# Patient Record
Sex: Male | Born: 1935 | Race: Black or African American | Hispanic: No
Health system: Southern US, Community
[De-identification: ages and names within clinical notes are randomized; demographics above are authoritative.]

---

## 2005-03-31 ENCOUNTER — Inpatient Hospital Stay: Payer: Self-pay | Admitting: Family Medicine

## 2005-04-15 ENCOUNTER — Ambulatory Visit: Payer: Self-pay | Admitting: General Surgery

## 2005-04-23 ENCOUNTER — Inpatient Hospital Stay: Payer: Self-pay | Admitting: General Surgery

## 2005-04-26 ENCOUNTER — Inpatient Hospital Stay: Payer: Self-pay | Admitting: General Surgery

## 2007-02-28 ENCOUNTER — Ambulatory Visit: Payer: Self-pay | Admitting: Family Medicine

## 2009-12-12 ENCOUNTER — Ambulatory Visit: Payer: Self-pay | Admitting: Family Medicine

## 2010-03-27 ENCOUNTER — Inpatient Hospital Stay: Payer: Self-pay | Admitting: Internal Medicine

## 2010-04-05 ENCOUNTER — Ambulatory Visit: Payer: Self-pay | Admitting: Internal Medicine

## 2010-04-15 ENCOUNTER — Ambulatory Visit: Payer: Self-pay | Admitting: Family Medicine

## 2010-04-22 ENCOUNTER — Ambulatory Visit: Payer: Self-pay | Admitting: Internal Medicine

## 2010-04-27 ENCOUNTER — Ambulatory Visit: Payer: Self-pay | Admitting: Internal Medicine

## 2010-05-01 ENCOUNTER — Ambulatory Visit: Payer: Self-pay | Admitting: Internal Medicine

## 2010-05-06 ENCOUNTER — Ambulatory Visit: Payer: Self-pay | Admitting: Internal Medicine

## 2010-05-07 ENCOUNTER — Ambulatory Visit: Payer: Self-pay | Admitting: Internal Medicine

## 2010-05-13 ENCOUNTER — Inpatient Hospital Stay: Payer: Self-pay | Admitting: General Surgery

## 2010-05-26 ENCOUNTER — Ambulatory Visit: Payer: Self-pay | Admitting: General Surgery

## 2010-06-04 ENCOUNTER — Ambulatory Visit: Payer: Self-pay | Admitting: General Surgery

## 2010-06-05 ENCOUNTER — Ambulatory Visit: Payer: Self-pay | Admitting: Internal Medicine

## 2010-06-29 ENCOUNTER — Ambulatory Visit: Payer: Self-pay | Admitting: General Surgery

## 2010-07-06 ENCOUNTER — Ambulatory Visit: Payer: Self-pay | Admitting: Internal Medicine

## 2010-09-29 ENCOUNTER — Ambulatory Visit: Payer: Self-pay | Admitting: Family Medicine

## 2010-10-13 ENCOUNTER — Ambulatory Visit: Payer: Self-pay | Admitting: General Surgery

## 2011-05-10 ENCOUNTER — Ambulatory Visit: Payer: Self-pay | Admitting: General Surgery

## 2011-05-12 ENCOUNTER — Ambulatory Visit: Payer: Self-pay | Admitting: General Surgery

## 2011-05-13 ENCOUNTER — Ambulatory Visit: Payer: Self-pay | Admitting: Internal Medicine

## 2011-06-06 ENCOUNTER — Ambulatory Visit: Payer: Self-pay | Admitting: Internal Medicine

## 2011-07-07 ENCOUNTER — Ambulatory Visit: Payer: Self-pay | Admitting: Internal Medicine

## 2011-08-07 ENCOUNTER — Ambulatory Visit: Payer: Self-pay | Admitting: Internal Medicine

## 2011-09-06 ENCOUNTER — Ambulatory Visit: Payer: Self-pay | Admitting: Internal Medicine

## 2011-10-07 ENCOUNTER — Ambulatory Visit: Payer: Self-pay | Admitting: Internal Medicine

## 2011-11-06 ENCOUNTER — Ambulatory Visit: Payer: Self-pay | Admitting: Internal Medicine

## 2011-12-13 ENCOUNTER — Ambulatory Visit: Payer: Self-pay | Admitting: Internal Medicine

## 2012-01-08 IMAGING — CT CT CHEST W/ CM
2 series · 15 of 31 positions shown, 19 images · non-contrast
Comparison: none

REASON FOR EXAM: abd normal chect xray with mass
COMMENTS:

[Series 2: soft tissue · axial · 0.92mm/px · z∈[+550,+600]mm · 2 of 67 slices shown]
[im 6/67  mediastinal]
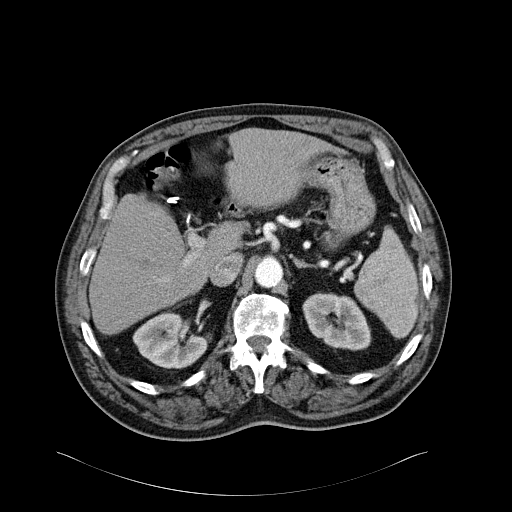
[im 16/67  mediastinal]
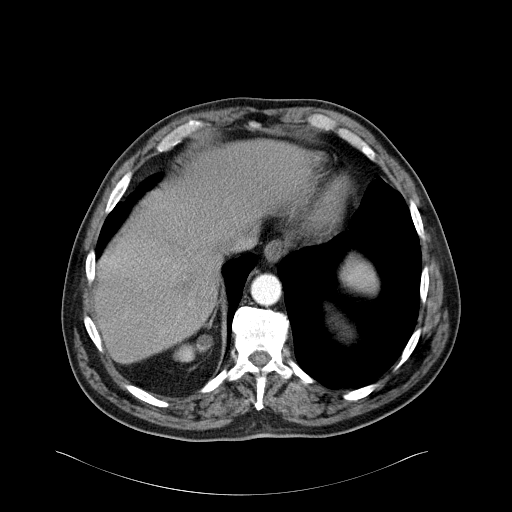

[Series 3: lung windows · axial · 0.86mm/px · z∈[+556,+830]mm · 13 of 65 slices shown, 17 images]
[im 5/65  mediastinal]
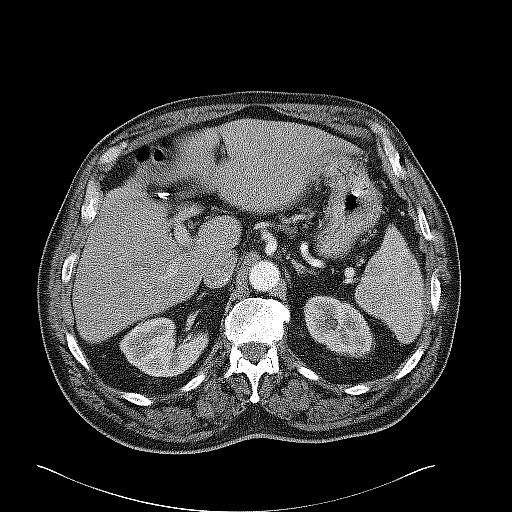
[im 5/65  lung]
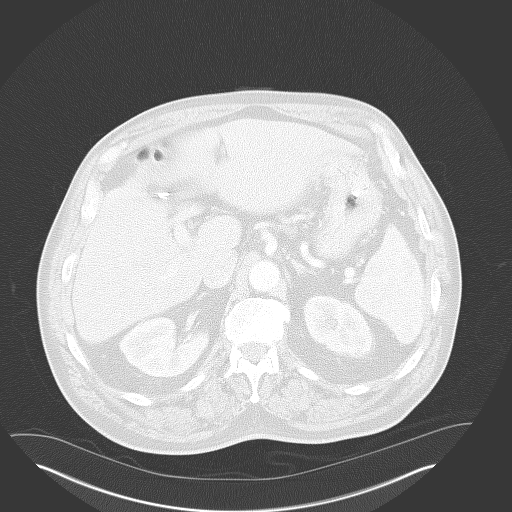
[im 10/65  lung]
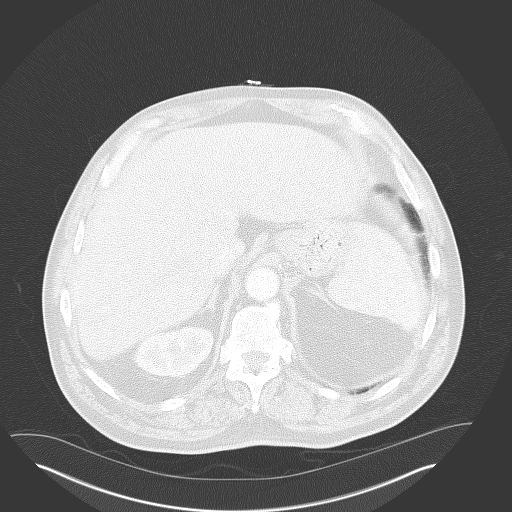
[im 15/65  lung]
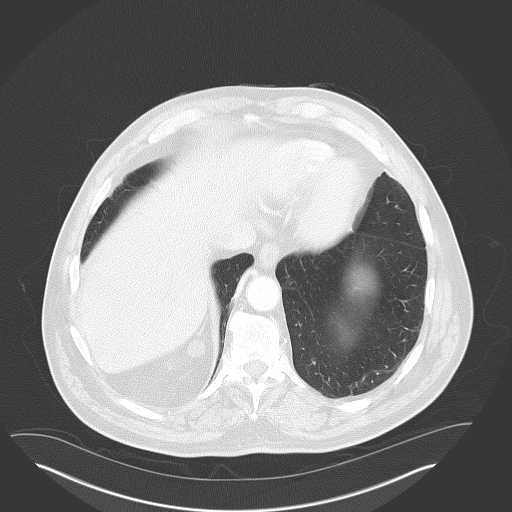
[im 20/65  lung]
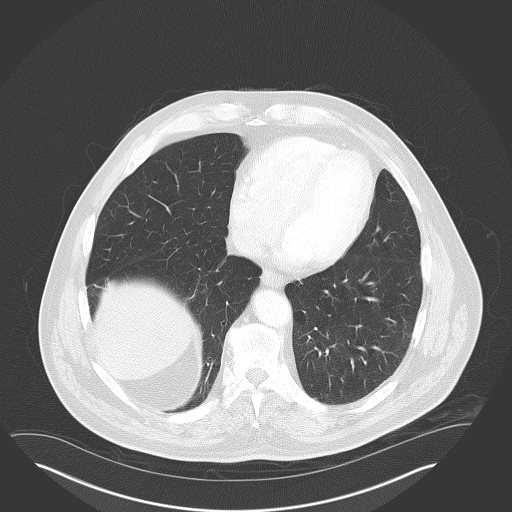
[im 25/65  mediastinal]
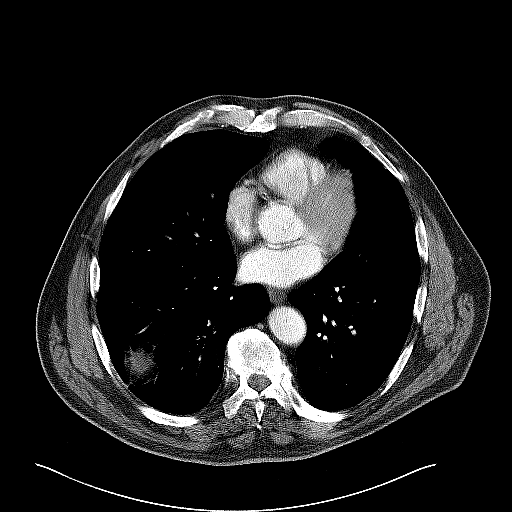
[im 25/65  lung]
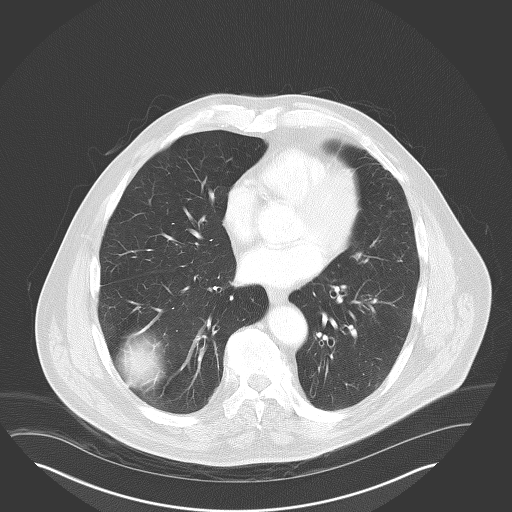
[im 30/65  lung]
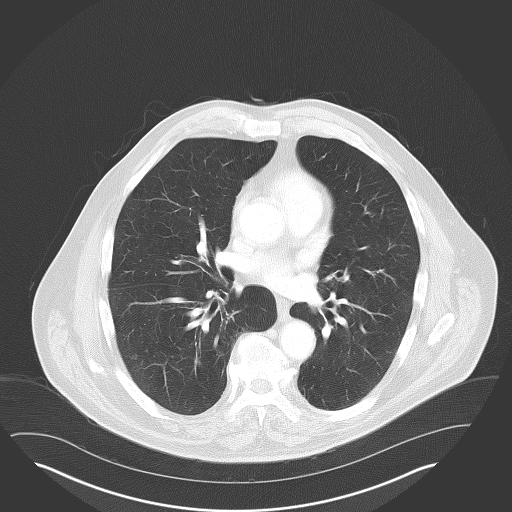
[im 33/65  lung]
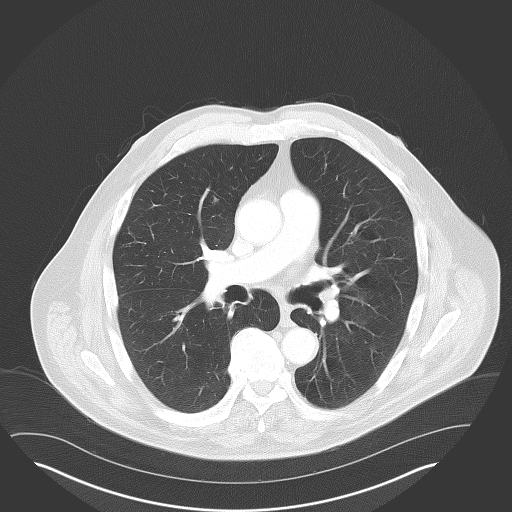
[im 35/65  lung]
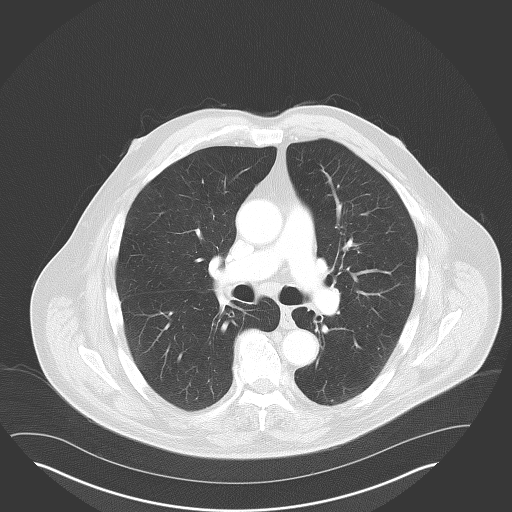
[im 40/65  mediastinal]
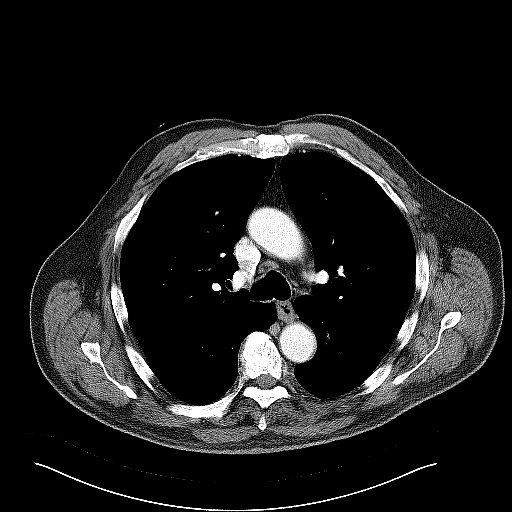
[im 40/65  lung]
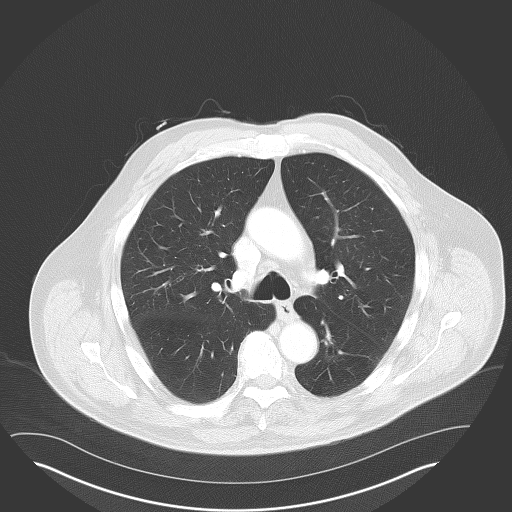
[im 45/65  lung]
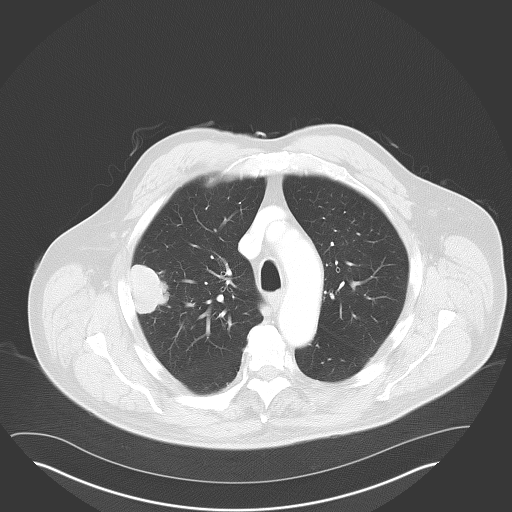
[im 50/65  lung]
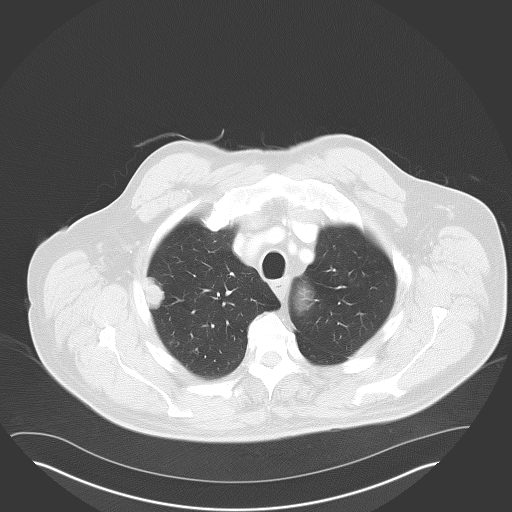
[im 55/65  lung]
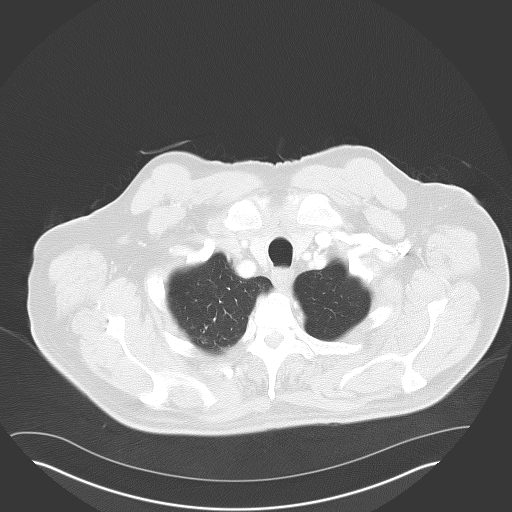
[im 60/65  mediastinal]
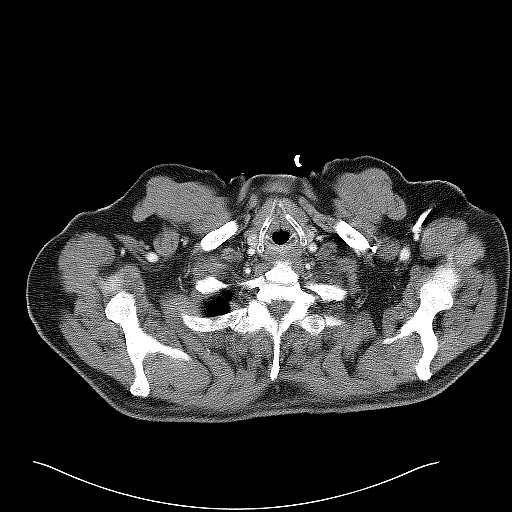
[im 60/65  lung]
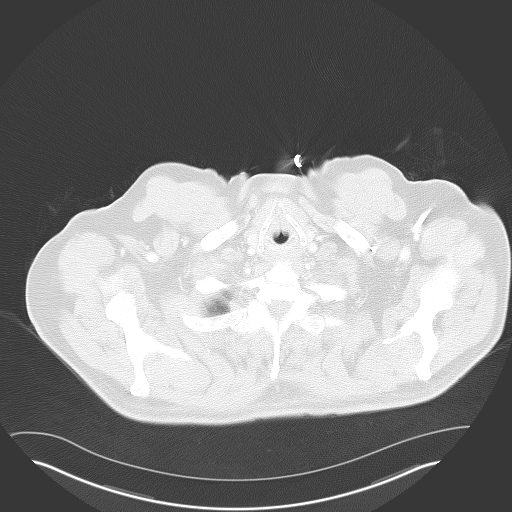

[15 of 31 positions shown; findings below may reference images not displayed]

PROCEDURE:     CT  - CT CHEST WITH CONTRAST  - April 15, 2010  [DATE]

RESULT:     CT of the chest is performed with 75 ml of Xsovue-D35 iodinated
intravenous contrast. The patient has no previous Chest CT for comparison.

A lateral right upper lobe pleural-based mass measures 4.02 cm anterior to
posterior and 2.70 cm medial to lateral on image #21 of the mediastinal
window settings. The superior to inferior dimension of this mass is
approximately 4.1 cm. No mediastinal or hilar mass or adenopathy is evident.
No calcification is appreciated within the mass. No pleural or pericardial
effusion is evident. Cholecystectomy clips are seen in the upper abdomen.
The aorta appears unremarkable. There is some minimal soft tissue density in
the right lower lobe on image #35 which appears to be somewhat bilobed and
measures 7.3 mm anterior to posterior x 7.3 mm medial to lateral. No
associated calcification is evident. Bronchiectasis is present in the left
lower lobe.
IMPRESSION: Right upper lobe lateral pleural-based pulmonary mass.
Malignancy is a concern. There is some minimal nodular density in the right
lower lobe. No definite mediastinal or hilar adenopathy by size criteria.
The adrenal glands are unremarkable. Oncologic consultation with PET CT
evaluation is recommended.

## 2012-06-15 ENCOUNTER — Ambulatory Visit: Payer: Self-pay | Admitting: Family Medicine

## 2012-06-21 ENCOUNTER — Emergency Department: Payer: Self-pay | Admitting: Emergency Medicine

## 2012-06-21 LAB — CBC WITH DIFFERENTIAL/PLATELET
Basophil #: 0 10*3/uL (ref 0.0–0.1)
Basophil %: 0.4 %
Eosinophil #: 0.1 10*3/uL (ref 0.0–0.7)
Eosinophil %: 1.1 %
HCT: 32.9 % — ABNORMAL LOW (ref 40.0–52.0)
MCH: 28 pg (ref 26.0–34.0)
MCHC: 32.7 g/dL (ref 32.0–36.0)
Monocyte #: 0.4 x10 3/mm (ref 0.2–1.0)
Neutrophil %: 74.4 %
Platelet: 151 10*3/uL (ref 150–440)
RDW: 19.2 % — ABNORMAL HIGH (ref 11.5–14.5)
WBC: 6 10*3/uL (ref 3.8–10.6)

## 2012-06-21 LAB — COMPREHENSIVE METABOLIC PANEL
Alkaline Phosphatase: 173 U/L — ABNORMAL HIGH (ref 50–136)
Anion Gap: 13 (ref 7–16)
Bilirubin,Total: 0.5 mg/dL (ref 0.2–1.0)
Chloride: 102 mmol/L (ref 98–107)
Co2: 24 mmol/L (ref 21–32)
Creatinine: 0.76 mg/dL (ref 0.60–1.30)
Osmolality: 277 (ref 275–301)
Sodium: 139 mmol/L (ref 136–145)
Total Protein: 8 g/dL (ref 6.4–8.2)

## 2012-06-23 ENCOUNTER — Ambulatory Visit: Payer: Self-pay | Admitting: Internal Medicine

## 2012-07-05 LAB — CBC CANCER CENTER
Basophil #: 0.1 x10 3/mm (ref 0.0–0.1)
Basophil %: 0.4 %
Lymphocyte #: 0.9 x10 3/mm — ABNORMAL LOW (ref 1.0–3.6)
Lymphocyte %: 4.8 %
MCHC: 32.8 g/dL (ref 32.0–36.0)
MCV: 87 fL (ref 80–100)
Monocyte %: 5.5 %
Neutrophil %: 89.2 %
Platelet: 128 x10 3/mm — ABNORMAL LOW (ref 150–440)
RBC: 4.81 10*6/uL (ref 4.40–5.90)
RDW: 21.3 % — ABNORMAL HIGH (ref 11.5–14.5)
WBC: 18.7 x10 3/mm — ABNORMAL HIGH (ref 3.8–10.6)

## 2012-07-06 ENCOUNTER — Ambulatory Visit: Payer: Self-pay | Admitting: Internal Medicine

## 2012-07-12 LAB — CBC CANCER CENTER
Basophil #: 0 x10 3/mm (ref 0.0–0.1)
Basophil %: 0.3 %
Eosinophil #: 0 x10 3/mm (ref 0.0–0.7)
Eosinophil %: 0 %
HCT: 41.9 % (ref 40.0–52.0)
HGB: 13.6 g/dL (ref 13.0–18.0)
Lymphocyte #: 0.9 x10 3/mm — ABNORMAL LOW (ref 1.0–3.6)
MCH: 28.6 pg (ref 26.0–34.0)
MCHC: 32.5 g/dL (ref 32.0–36.0)
MCV: 88 fL (ref 80–100)
Monocyte %: 4.2 %
Neutrophil #: 13.5 x10 3/mm — ABNORMAL HIGH (ref 1.4–6.5)
RBC: 4.78 10*6/uL (ref 4.40–5.90)
WBC: 15 x10 3/mm — ABNORMAL HIGH (ref 3.8–10.6)

## 2012-07-19 LAB — CBC CANCER CENTER
Basophil #: 0 x10 3/mm (ref 0.0–0.1)
Basophil %: 0.5 %
Eosinophil #: 0 x10 3/mm (ref 0.0–0.7)
HCT: 38.6 % — ABNORMAL LOW (ref 40.0–52.0)
HGB: 12.7 g/dL — ABNORMAL LOW (ref 13.0–18.0)
Lymphocyte #: 0.5 x10 3/mm — ABNORMAL LOW (ref 1.0–3.6)
Lymphocyte %: 6.5 %
MCH: 28.9 pg (ref 26.0–34.0)
MCHC: 32.9 g/dL (ref 32.0–36.0)
MCV: 88 fL (ref 80–100)
Monocyte #: 0.3 x10 3/mm (ref 0.2–1.0)
Neutrophil #: 7 x10 3/mm — ABNORMAL HIGH (ref 1.4–6.5)
RDW: 20.8 % — ABNORMAL HIGH (ref 11.5–14.5)

## 2012-07-26 LAB — CBC CANCER CENTER
Basophil #: 0 x10 3/mm (ref 0.0–0.1)
Basophil %: 0.2 %
Eosinophil %: 0.1 %
Lymphocyte #: 1.2 x10 3/mm (ref 1.0–3.6)
Lymphocyte %: 10.6 %
MCV: 87 fL (ref 80–100)
Monocyte %: 3.3 %
Platelet: 86 x10 3/mm — ABNORMAL LOW (ref 150–440)
RBC: 4.37 10*6/uL — ABNORMAL LOW (ref 4.40–5.90)
WBC: 10.9 x10 3/mm — ABNORMAL HIGH (ref 3.8–10.6)

## 2012-08-06 ENCOUNTER — Ambulatory Visit: Payer: Self-pay | Admitting: Internal Medicine

## 2012-11-05 DEATH — deceased

## 2013-07-21 IMAGING — CT CT CHEST W/ CM
1 of 2 series · 15 of 31 positions shown, 19 images · IV contrast (agent unspecified)
Comparison: none

REASON FOR EXAM: restaging metastatic lung cancer
COMMENTS:

PROCEDURE:     KCT - KCT CHEST WITH CONTRAST  - October 27, 2011 [DATE]
RESULT:
TECHNIQUE: CT of the chest is performed with 75 ml of 8sovue-IFT iodinated
intravenous contrast with images reconstructed at 5.0 mm slice thickness in
the axial plane and compared to the previous exam of 26 August, 2011.

[Series 2: chest w/ 5.0 i41f 3 · axial · 0.79mm/px · z∈[-377,-97]mm · 15 of 66 slices shown, 19 images]
[im 5/66  mediastinal]
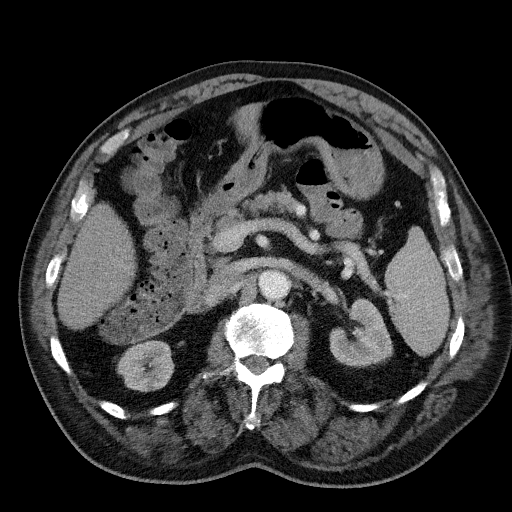
[im 5/66  lung]
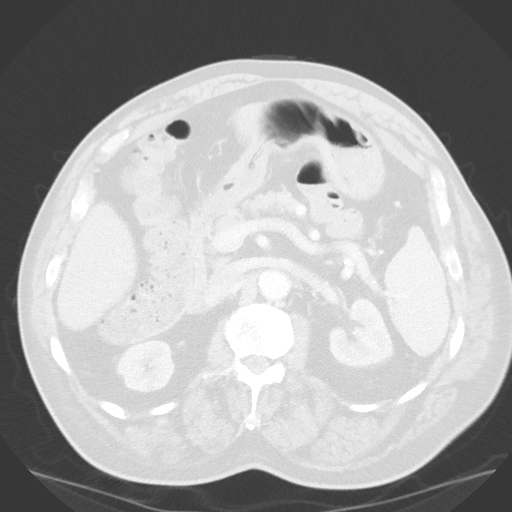
[im 9/66  lung]
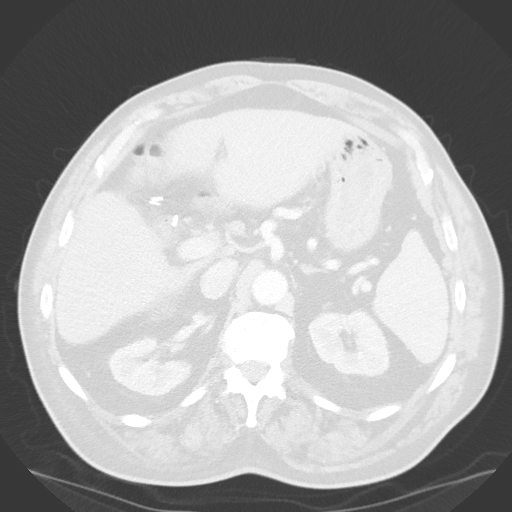
[im 14/66  lung]
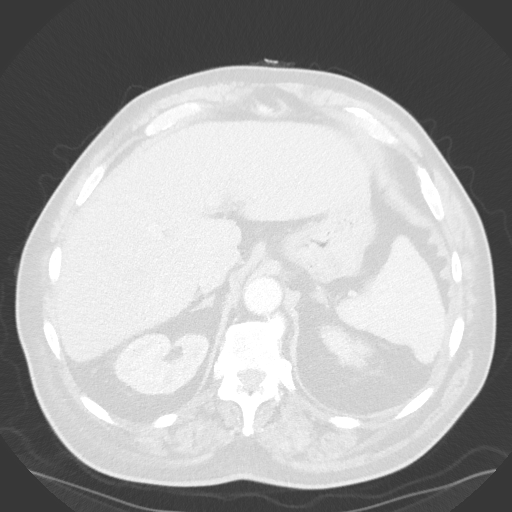
[im 18/66  lung]
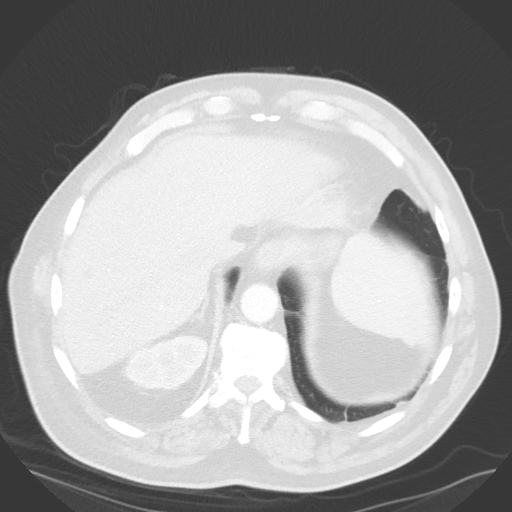
[im 22/66  mediastinal]
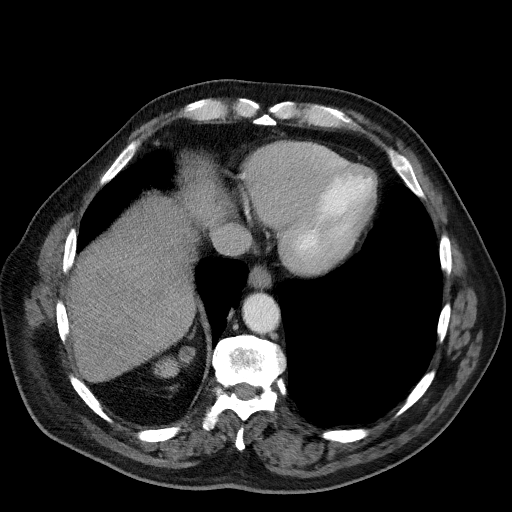
[im 22/66  lung]
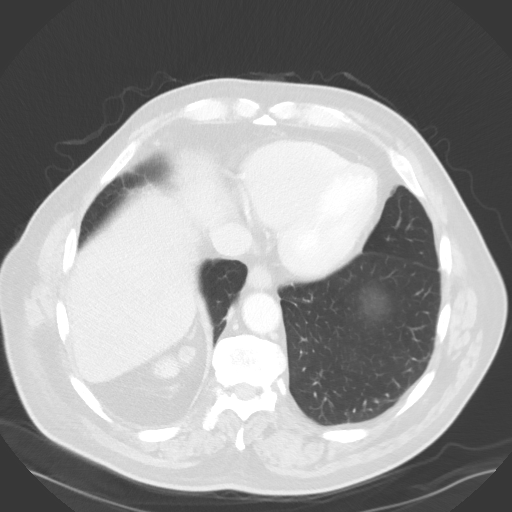
[im 27/66  lung]
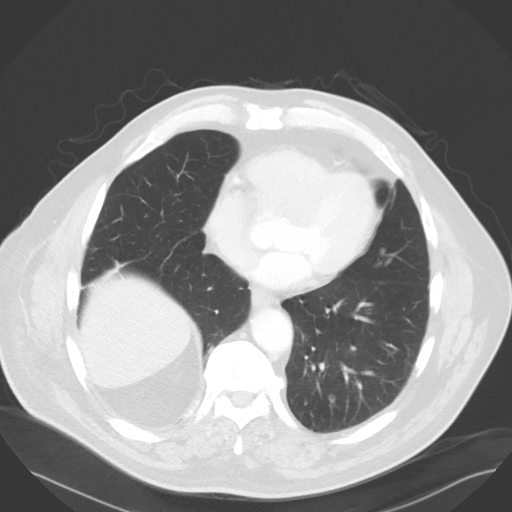
[im 31/66  lung]
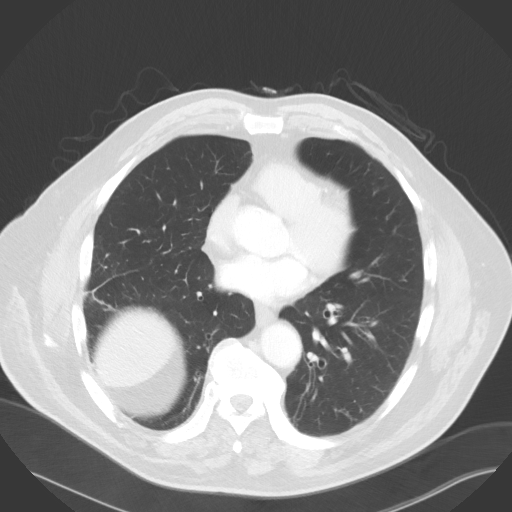
[im 33/66  lung]
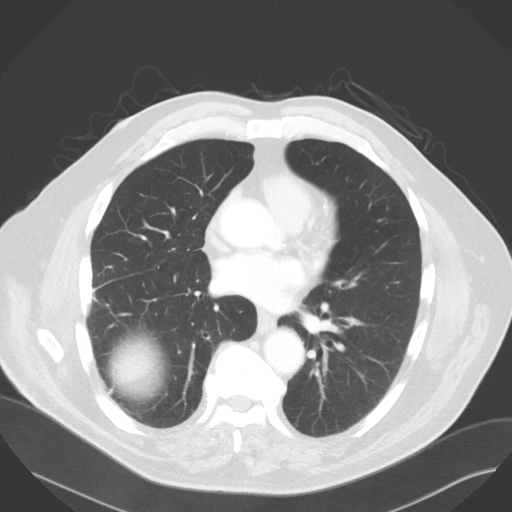
[im 35/66  mediastinal]
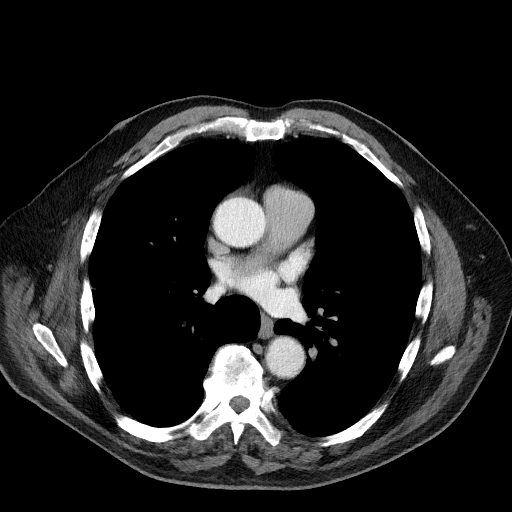
[im 35/66  lung]
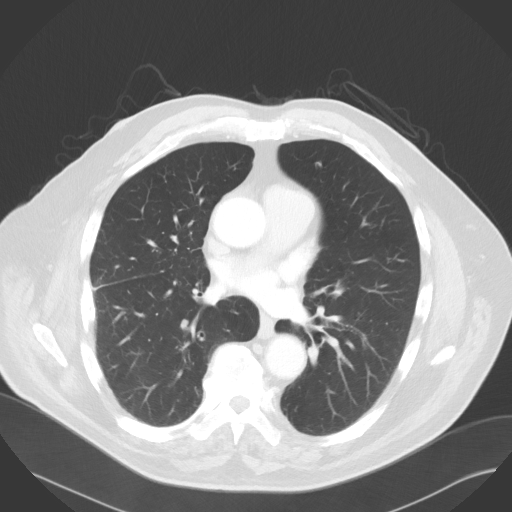
[im 40/66  lung]
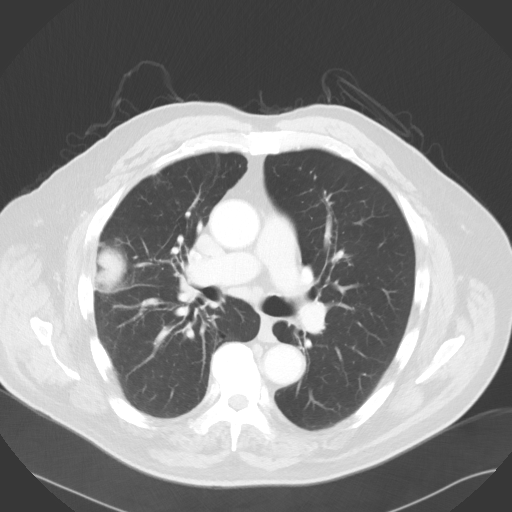
[im 44/66  lung]
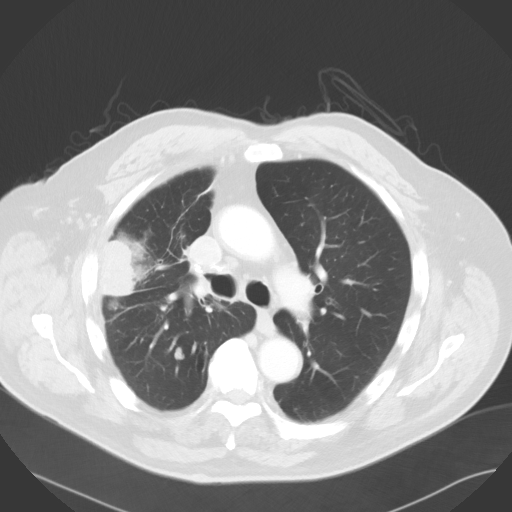
[im 48/66  lung]
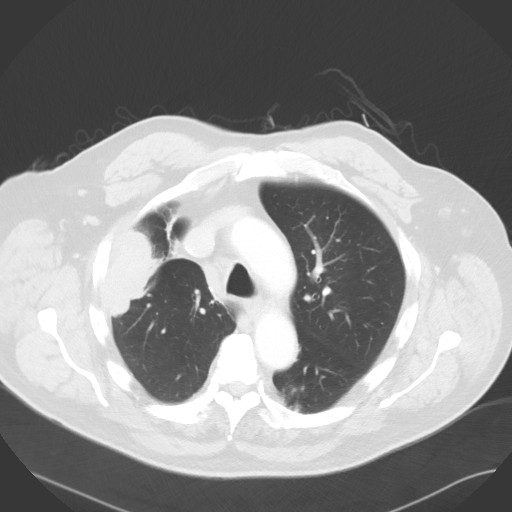
[im 53/66  mediastinal]
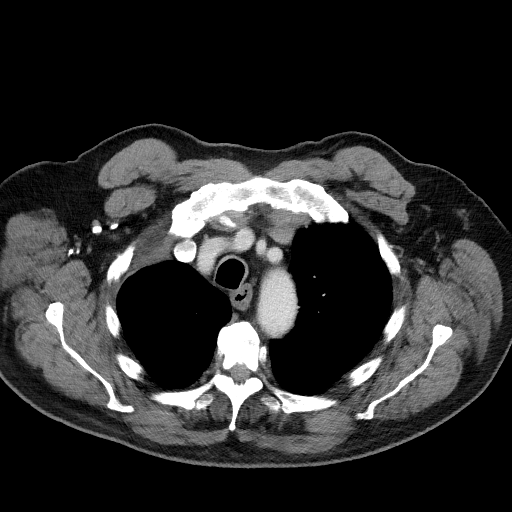
[im 53/66  lung]
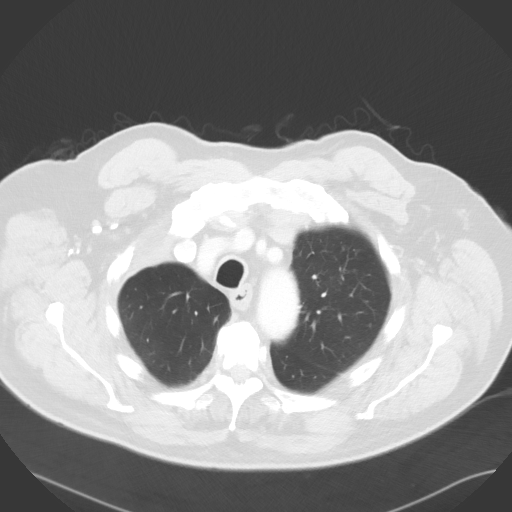
[im 57/66  lung]
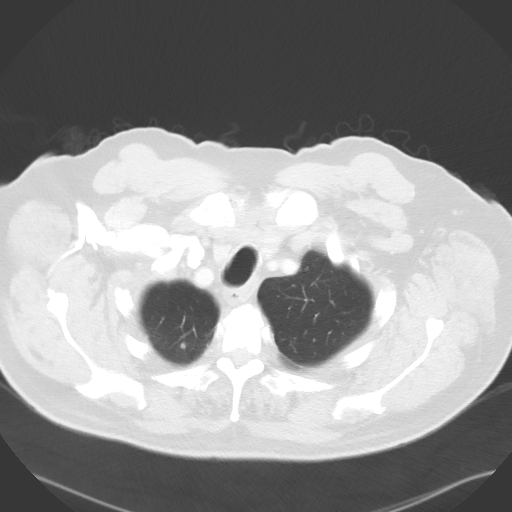
[im 61/66  lung]
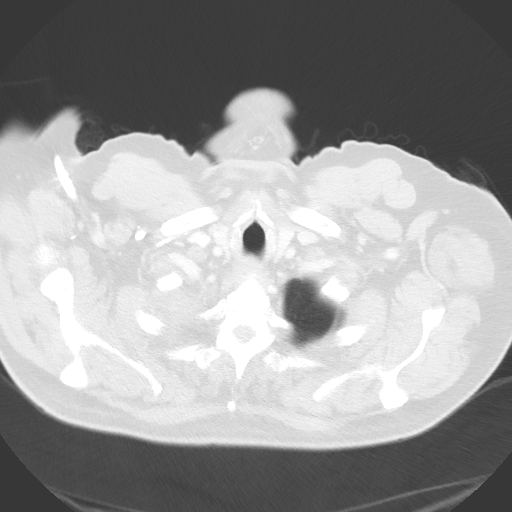

[15 of 31 positions shown; findings below may reference images not displayed]

FINDINGS: The pleural based, right upper lobe mass measures 6.84 x 3.40 cm
at lung window settings on image #20. A small, right upper lobe apical
nodule measures 3.3 mm on image #10 with a 0.80 cm in diameter nodule in the
right lower lobe on image #23. On image #32, there is a right lower lobe
nodular density somewhat centrally measuring up to 6.2 mm in diameter.
Minimal bullous change is seen in the para-aortic left lower lobe with a
diameter of 1.48 cm. Minimal dependent atelectasis is present bilaterally.
No mediastinal adenopathy is present. The adrenal glands appear normal. The
kidneys show no obstruction or discrete mass. The included portions of the
liver and spleen as well as the pancreas are unremarkable.
IMPRESSION: Right upper lobe mass essentially stable compared to the
previous study. Scattered nodular parenchymal densities in the right lung
appear to be stable in the upper lobe region. The lower lobe nodular density
may be slightly larger.

## 2015-03-25 NOTE — Consult Note (Signed)
Reason for Visit: Referred by Dr. Ma Hillock.  Diagnosis:   Chief Complaint/Diagnosis   81 YO BM with HA, L sided weakness, and history of  TC seizure LUE. 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 He  is referred for consultation and treatment recommendations by Dr. Lowella Petties with a newly discovered 2 cm R parietal  solitary brain metastasis, with known persitent chest disease. There is no shift or mass effect. Dr. Buelah Manis reports significant HA, and persitent L  sided weakness. He has instituted Hospice today, and given him a dose of decadron, scrip for decaeron and ativan.  He presented to his PCP  last Wednesday, and had a noncontrast CT 06/15/12, and another one 06/17/12 in the ER  after a tonic clonic seizure.both  showed   a  solitary R superior parietal lobe metastasis.Associated  vasogenic edema is about the same.He denies n/v, but is having trouble with urgent need to go to the bathroom after eating. He can mange to ambulate, but if he sits on toilet, can't get up. His mood is pretty upbeat. XRT:  Palliative radiation for neck pain, and cervical MRI showed involvement of the upper C spine. XRT of 30 Gy/15 fractions completed 06/07/11  He is with his grandson who is driving. Lives alone, sister nearby is involved. gettting bathroom fitted to accomodate for L sided weakness treated by Dr. Baruch Gouty a year ago with   Past, Family and Social History:   Family History noncontributory    Home Environment lives alone    Additional Past Medical and Surgical History see HPI for further details   Allergies:   No Known Allergies:    Review of Systems:   General negative    Performance Status (ECOG) 2    Skin negative    Breast negative    Ophthalmologic negative    ENMT negative    Respiratory and Thorax negative    Cardiovascular negative    Gastrointestinal see HPI    Genitourinary negative    Musculoskeletal see HPI    Neurological see HPI    Psychiatric negative    Hematology/Lymphatics negative    Endocrine negative    Allergic/Immunologic negative   Physical Exam:  MS/Neuro/Psych/Lymph:   Physical Exam 81 YO rather chipper BM with his grandson,. EOMI, no palpable neck nodes, lungs clear to A/P, Heart RSR, No boney tenderness of the spine to percussion.  Neuro: Strength, RUE and RLE 5/5. LUE/LLE 3/5, reflexes not obtainable today, but no clonus, no Babinski, Gait not tested. cogent without difficulty speaking or understanding.   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  Hospice and [facility]  He is stable, on decadron given in the clinc today. Will take as OP and RTC Monday to see Dr. Baruch Gouty for palliative XRT. Junction with C spine field will be gapped. Marland Kitchen  Electronic Signatures: Juan Quam (MD)  (Signed 19-Jul-13 16:59)  Authored: HPI, Diagnosis, PFSH, Allergies, ROS, Physical Exam, Encounter Assessment and Plan   Last Updated: 19-Jul-13 16:59 by Juan Quam (MD)
